# Patient Record
Sex: Female | Born: 1960 | Race: White | Hispanic: No | Marital: Single | Smoking: Former smoker
Health system: Southern US, Community
[De-identification: ages and names within clinical notes are randomized; demographics above are authoritative.]

## PROBLEM LIST (undated history)

## (undated) HISTORY — PX: ABDOMINAL HYSTERECTOMY: SHX81

---

## 2019-03-20 ENCOUNTER — Encounter (HOSPITAL_BASED_OUTPATIENT_CLINIC_OR_DEPARTMENT_OTHER): Payer: Self-pay | Admitting: *Deleted

## 2019-03-20 ENCOUNTER — Other Ambulatory Visit: Payer: Self-pay

## 2019-03-20 ENCOUNTER — Emergency Department (HOSPITAL_BASED_OUTPATIENT_CLINIC_OR_DEPARTMENT_OTHER)

## 2019-03-20 ENCOUNTER — Emergency Department (HOSPITAL_BASED_OUTPATIENT_CLINIC_OR_DEPARTMENT_OTHER)
Admission: EM | Admit: 2019-03-20 | Discharge: 2019-03-20 | Disposition: A | Discharge status: Home / Self Care | Attending: Emergency Medicine | Admitting: Emergency Medicine

## 2019-03-20 DIAGNOSIS — Y929 Unspecified place or not applicable: Secondary | ICD-10-CM | POA: Insufficient documentation

## 2019-03-20 DIAGNOSIS — S8991XA Unspecified injury of right lower leg, initial encounter: Secondary | ICD-10-CM | POA: Diagnosis present

## 2019-03-20 DIAGNOSIS — S50312A Abrasion of left elbow, initial encounter: Secondary | ICD-10-CM | POA: Insufficient documentation

## 2019-03-20 DIAGNOSIS — S60811A Abrasion of right wrist, initial encounter: Secondary | ICD-10-CM | POA: Insufficient documentation

## 2019-03-20 DIAGNOSIS — S8001XA Contusion of right knee, initial encounter: Secondary | ICD-10-CM | POA: Insufficient documentation

## 2019-03-20 DIAGNOSIS — W19XXXA Unspecified fall, initial encounter: Secondary | ICD-10-CM | POA: Insufficient documentation

## 2019-03-20 DIAGNOSIS — Y990 Civilian activity done for income or pay: Secondary | ICD-10-CM | POA: Diagnosis not present

## 2019-03-20 DIAGNOSIS — Y9389 Activity, other specified: Secondary | ICD-10-CM | POA: Diagnosis not present

## 2019-03-20 MED ORDER — IBUPROFEN 800 MG PO TABS
800.0000 mg | ORAL_TABLET | Freq: Once | ORAL | Status: AC
  Administered 2019-03-20: 17:00:00 800 mg via ORAL
  Filled 2019-03-20: qty 1

## 2019-03-20 NOTE — ED Triage Notes (Signed)
Fell at work.  Pain to right knee, bruised to left posterior elbow and right wrist.  Scrape to right knee.

## 2019-03-20 NOTE — Discharge Instructions (Signed)
No fracture seen on your x-ray of your right knee.  Continue ice, Tylenol, Motrin for pain and swelling.  You likely have a bone contusion/knee sprain.  Follow-up with your primary care doctor.

## 2019-03-20 NOTE — ED Notes (Signed)
Patient stated that she don't need crutches.

## 2019-03-20 NOTE — ED Provider Notes (Signed)
[facility] EM[facility] DEPARTMENT Provider Note   CSN: [id number] Arrival date & time: 03/20/19  1701     History   Chief Complaint Chief Complaint  Patient presents with  . Fall    HPI Lenice Koper is a 58 y.o. female.     The history is provided by the patient.  Knee Pain Location:  Knee Injury: yes   Knee location:  R knee Pain details:    Quality:  Aching   Radiates to:  Does not radiate   Severity:  Mild   Onset quality:  Sudden   Timing:  Intermittent   Progression:  Waxing and waning Chronicity:  New Relieved by:  Nothing Worsened by:  Bearing weight Associated symptoms: stiffness and swelling   Associated symptoms: no back pain, no decreased ROM, no fatigue, no fever, no itching, no muscle weakness and no neck pain     No past medical history on file.  There are no active problems to display for this patient.      OB History   No obstetric history on file.      Home Medications    Prior to Admission medications   Not on File    Family History No family history on file.  Social History Social History   Tobacco Use  . Smoking status: Not on file  Substance Use Topics  . Alcohol use: Not on file  . Drug use: Not on file     Allergies   Codeine and Sulfa antibiotics   Review of Systems Review of Systems  Constitutional: Negative for fatigue and fever.  Musculoskeletal: Positive for arthralgias, gait problem and stiffness. Negative for back pain, myalgias, neck pain and neck stiffness.  Skin: Positive for color change. Negative for itching, pallor, rash and wound.     Physical Exam Updated Vital Signs BP (!) 109/55 (BP Location: Right Arm)   Pulse 72   Temp 98 F (36.7 C) (Oral)   Resp 16   Ht 5\' 8"  (1.727 m)   Wt 53.5 kg   SpO2 100%   BMI 17.94 kg/m   Physical Exam Constitutional:      General: She is not in acute distress.    Appearance: She is not ill-appearing.  HENT:     Head: Normocephalic.   Cardiovascular:     Pulses: Normal pulses.  Musculoskeletal: Normal range of motion.        General: Swelling and tenderness (right knee) present. No deformity or signs of injury.  Skin:    General: Skin is warm.     Comments: Bruise to left elbow, right wrist, right knee  Neurological:     General: No focal deficit present.     Mental Status: She is alert.     Sensory: No sensory deficit.     Motor: No weakness.      ED Treatments / Results  Labs (all labs ordered are listed, but only abnormal results are displayed) Labs Reviewed - No data to display  EKG None  Radiology No results found.  Procedures Procedures (including critical care time)  Medications Ordered in ED Medications  ibuprofen (ADVIL) tablet 800 mg (has no administration in time range)     Initial Impression / Assessment and Plan / ED Course  I have reviewed the triage vital signs and the nursing notes.  Pertinent labs & imaging results that were available during my care of the patient were reviewed by me and considered in my medical decision making (  see chart for details).     Enid CutterDonna Buresh is a 58 year old female who presents the ED with right knee pain after a fall at work.  Patient has been able to ambulate since.  Has minor bruise to the left posterior elbow and right wrist but does not have any really specific tenderness there.  Mostly has pain in the right knee.  X-ray showed no acute fracture or malalignment of the right knee.  She had normal strength and sensation of the right lower extremity.  Likely knee contusion.  Given crutches, Motrin.  Given knee immobilizer.  Recommend ice, Tylenol, Motrin.  Recommend follow-up with primary care doctor or sports medicine.  Weightbearing as tolerated.  Given return precautions.  This chart was dictated using voice recognition software.  Despite best efforts to proofread,  errors can occur which can change the documentation meaning.    Final Clinical  Impressions(s) / ED Diagnoses   Final diagnoses:  Contusion of right knee, initial encounter    ED Discharge Orders    None       Virgina NorfolkCuratolo, Hortense Cantrall, DO 03/20/19 1722

## 2019-03-20 NOTE — ED Notes (Signed)
Pt refused crutches.

## 2021-02-18 IMAGING — DX DG KNEE COMPLETE 4+V*R*
4 series · 4 of 4 positions shown · non-contrast
Comparison: None.

CLINICAL DATA: Fell at work, knee pain

EXAM:
RIGHT KNEE - COMPLETE 4+ VIEW

[knee ap]
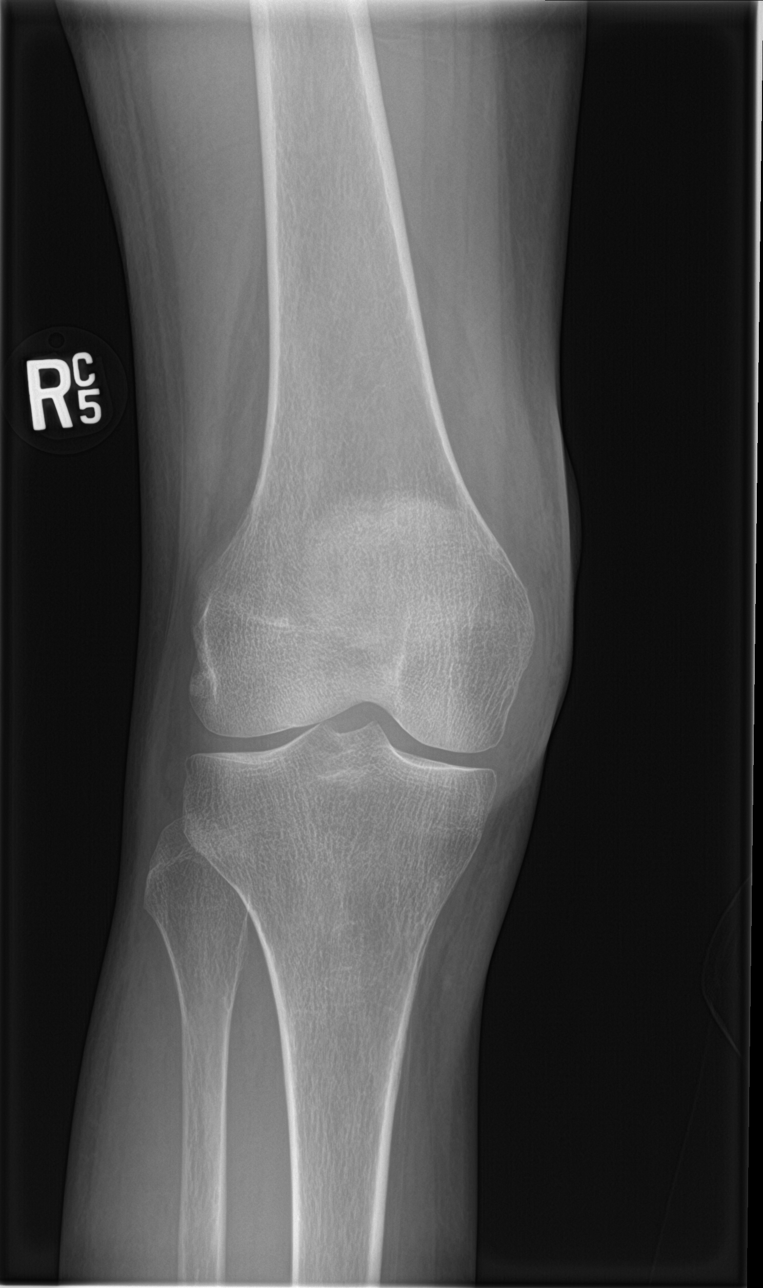

[knee lat]
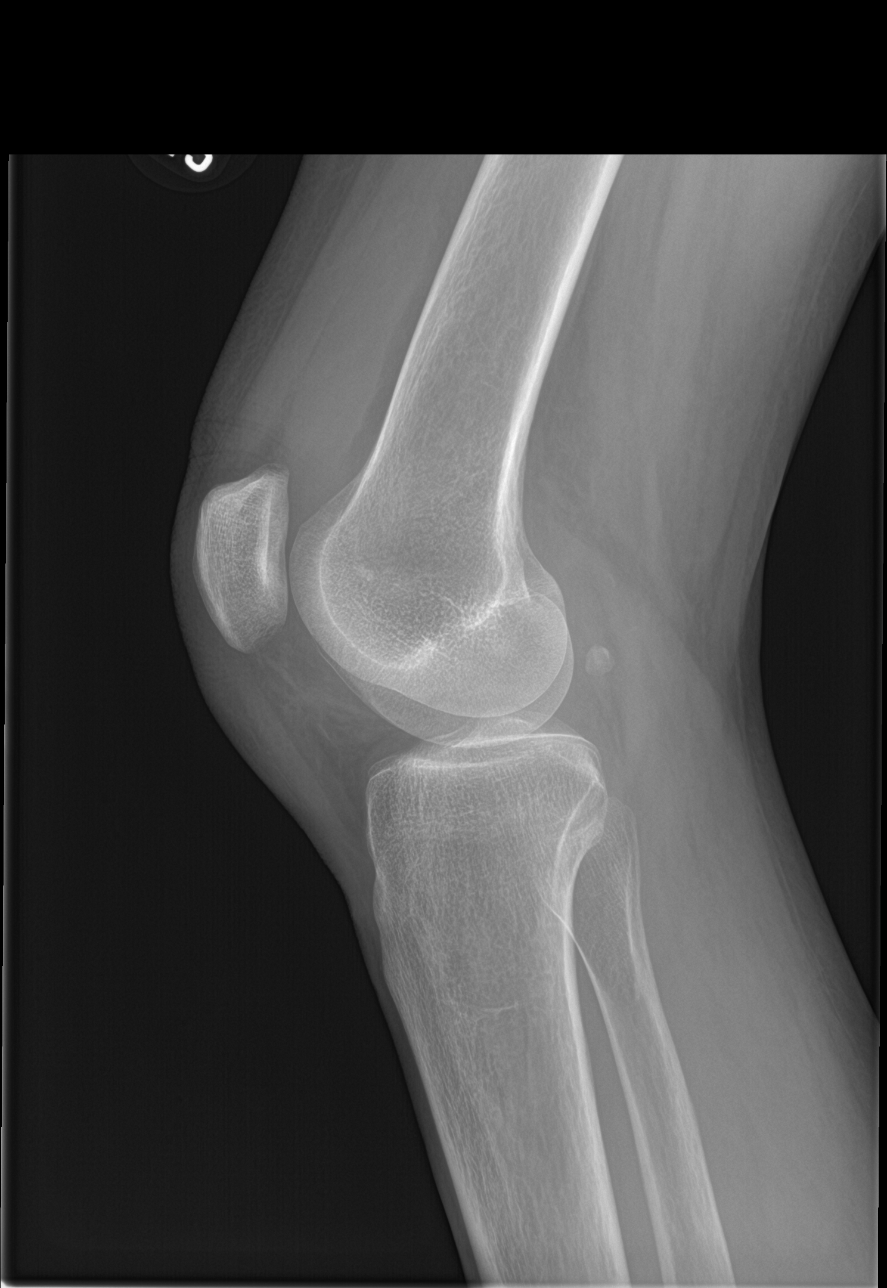

[knee obl (1 of 2)]
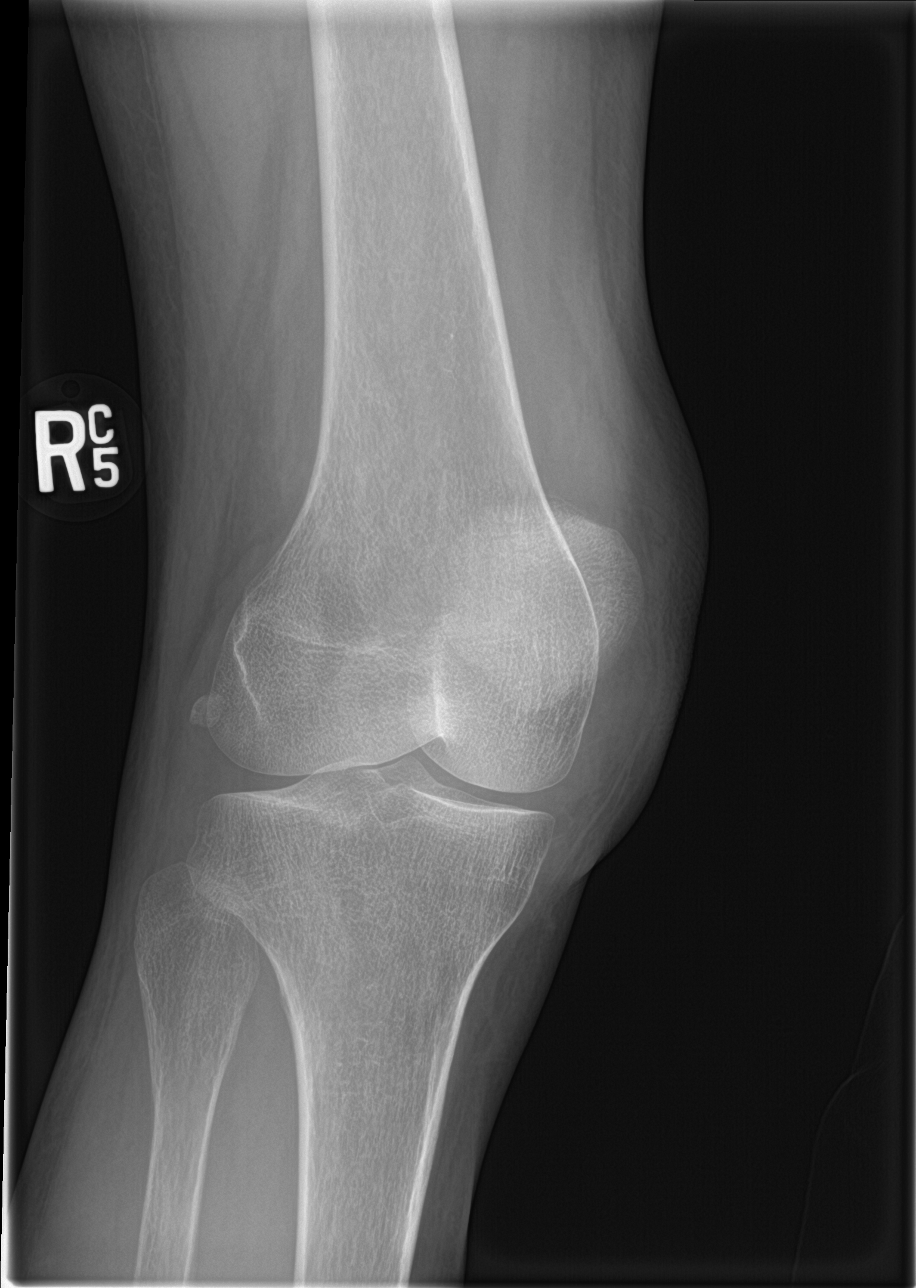

[knee obl (2 of 2)]
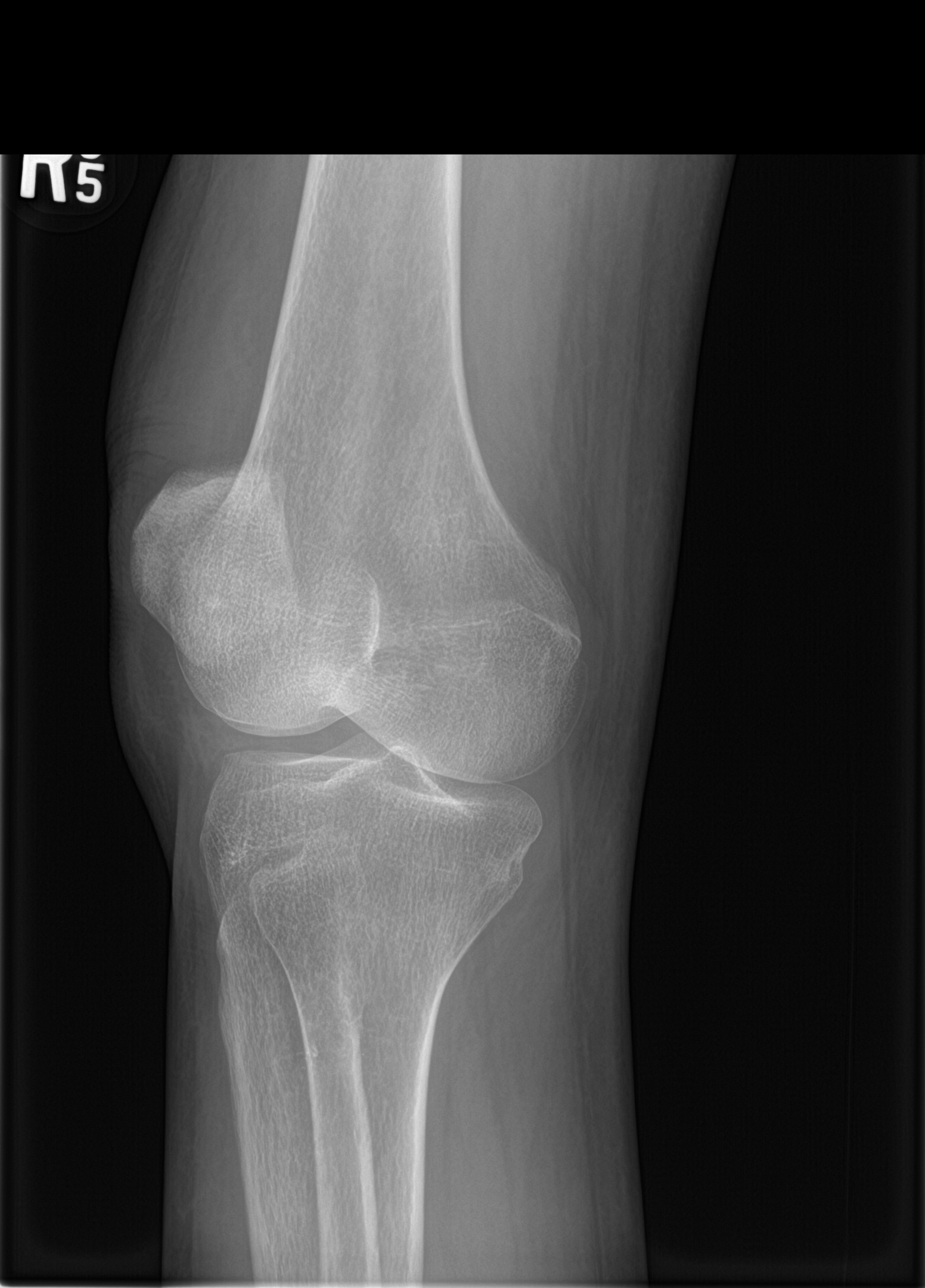

[4 of 4 positions shown; findings below may reference images not displayed]

FINDINGS: No acute displaced fracture or malalignment. Moderate to large knee
effusion. Joint spaces are maintained
IMPRESSION: 1. No acute osseous abnormality
2. Moderate to large knee effusion
